# Patient Record
Sex: Female | Born: 1960 | Race: White | Hispanic: No | Marital: Married | Smoking: Never smoker
Health system: Southern US, Community
[De-identification: ages and names within clinical notes are randomized; demographics above are authoritative.]

## PROBLEM LIST (undated history)

## (undated) DIAGNOSIS — Z780 Asymptomatic menopausal state: Secondary | ICD-10-CM

## (undated) DIAGNOSIS — I10 Essential (primary) hypertension: Secondary | ICD-10-CM

---

## 2017-06-20 ENCOUNTER — Emergency Department: Payer: BC Managed Care – PPO

## 2017-06-20 ENCOUNTER — Other Ambulatory Visit: Payer: Self-pay

## 2017-06-20 ENCOUNTER — Encounter: Payer: Self-pay | Admitting: Emergency Medicine

## 2017-06-20 ENCOUNTER — Observation Stay: Payer: BC Managed Care – PPO

## 2017-06-20 ENCOUNTER — Observation Stay
Admission: EM | Admit: 2017-06-20 | Discharge: 2017-06-22 | Disposition: A | Discharge status: Home / Self Care | Payer: BC Managed Care – PPO | Attending: Orthopaedic Surgery | Admitting: Orthopaedic Surgery

## 2017-06-20 DIAGNOSIS — S82851A Displaced trimalleolar fracture of right lower leg, initial encounter for closed fracture: Secondary | ICD-10-CM | POA: Diagnosis not present

## 2017-06-20 DIAGNOSIS — I10 Essential (primary) hypertension: Secondary | ICD-10-CM | POA: Insufficient documentation

## 2017-06-20 DIAGNOSIS — S99911A Unspecified injury of right ankle, initial encounter: Secondary | ICD-10-CM | POA: Diagnosis present

## 2017-06-20 DIAGNOSIS — Z01818 Encounter for other preprocedural examination: Secondary | ICD-10-CM | POA: Diagnosis present

## 2017-06-20 DIAGNOSIS — W109XXA Fall (on) (from) unspecified stairs and steps, initial encounter: Secondary | ICD-10-CM | POA: Insufficient documentation

## 2017-06-20 DIAGNOSIS — S82409A Unspecified fracture of shaft of unspecified fibula, initial encounter for closed fracture: Secondary | ICD-10-CM

## 2017-06-20 DIAGNOSIS — S82891A Other fracture of right lower leg, initial encounter for closed fracture: Secondary | ICD-10-CM

## 2017-06-20 HISTORY — DX: Essential (primary) hypertension: I10

## 2017-06-20 HISTORY — DX: Asymptomatic menopausal state: Z78.0

## 2017-06-20 LAB — BASIC METABOLIC PANEL
ANION GAP: 14 (ref 5–15)
BUN: 20 mg/dL (ref 6–20)
CALCIUM: 9.4 mg/dL (ref 8.9–10.3)
CO2: 21 mmol/L — ABNORMAL LOW (ref 22–32)
Chloride: 100 mmol/L — ABNORMAL LOW (ref 101–111)
Creatinine, Ser: 0.84 mg/dL (ref 0.44–1.00)
GLUCOSE: 110 mg/dL — AB (ref 65–99)
Potassium: 3.2 mmol/L — ABNORMAL LOW (ref 3.5–5.1)
SODIUM: 135 mmol/L (ref 135–145)

## 2017-06-20 LAB — CBC WITH DIFFERENTIAL/PLATELET
BASOS ABS: 0.1 10*3/uL (ref 0–0.1)
BASOS PCT: 2 %
Eosinophils Absolute: 0.1 10*3/uL (ref 0–0.7)
Eosinophils Relative: 1 %
HEMATOCRIT: 35.7 % (ref 35.0–47.0)
Hemoglobin: 11.7 g/dL — ABNORMAL LOW (ref 12.0–16.0)
Lymphocytes Relative: 39 %
Lymphs Abs: 2.5 10*3/uL (ref 1.0–3.6)
MCH: 29.6 pg (ref 26.0–34.0)
MCHC: 32.8 g/dL (ref 32.0–36.0)
MCV: 90.1 fL (ref 80.0–100.0)
MONO ABS: 0.7 10*3/uL (ref 0.2–0.9)
Monocytes Relative: 11 %
NEUTROS ABS: 3 10*3/uL (ref 1.4–6.5)
NEUTROS PCT: 47 %
Platelets: 238 10*3/uL (ref 150–440)
RBC: 3.96 MIL/uL (ref 3.80–5.20)
RDW: 13 % (ref 11.5–14.5)
WBC: 6.4 10*3/uL (ref 3.6–11.0)

## 2017-06-20 LAB — TYPE AND SCREEN
ABO/RH(D): A NEG
Antibody Screen: NEGATIVE

## 2017-06-20 LAB — PROTIME-INR
INR: 1
Prothrombin Time: 13.1 seconds (ref 11.4–15.2)

## 2017-06-20 MED ORDER — PROPOFOL 10 MG/ML IV BOLUS
INTRAVENOUS | Status: AC | PRN
  Administered 2017-06-20: 70 mg via INTRAVENOUS
  Administered 2017-06-20: 35 mg via INTRAVENOUS

## 2017-06-20 MED ORDER — ONDANSETRON HCL 4 MG PO TABS: 4.0000 mg | ORAL_TABLET | Freq: Four times a day (QID) | ORAL | Status: DC | PRN

## 2017-06-20 MED ORDER — ACETAMINOPHEN 325 MG PO TABS: 650.0000 mg | ORAL_TABLET | Freq: Four times a day (QID) | ORAL | Status: DC | PRN

## 2017-06-20 MED ORDER — BISACODYL 5 MG PO TBEC: 5.0000 mg | DELAYED_RELEASE_TABLET | Freq: Every day | ORAL | Status: DC | PRN

## 2017-06-20 MED ORDER — METHOCARBAMOL 1000 MG/10ML IJ SOLN: 500.0000 mg | Freq: Four times a day (QID) | INTRAVENOUS | Status: DC | PRN

## 2017-06-20 MED ORDER — HYDROMORPHONE HCL 1 MG/ML IJ SOLN
INTRAMUSCULAR | Status: AC
  Administered 2017-06-20: 1 mg via INTRAVENOUS
  Filled 2017-06-20: qty 1

## 2017-06-20 MED ORDER — METHOCARBAMOL 500 MG PO TABS
500.0000 mg | ORAL_TABLET | Freq: Four times a day (QID) | ORAL | Status: DC | PRN
  Administered 2017-06-20 – 2017-06-22 (×2): 500 mg via ORAL
  Filled 2017-06-20 (×2): qty 1

## 2017-06-20 MED ORDER — PROPOFOL 10 MG/ML IV BOLUS
INTRAVENOUS | Status: AC
  Filled 2017-06-20: qty 20

## 2017-06-20 MED ORDER — DOCUSATE SODIUM 100 MG PO CAPS
100.0000 mg | ORAL_CAPSULE | Freq: Two times a day (BID) | ORAL | Status: DC
  Administered 2017-06-21: 100 mg via ORAL
  Filled 2017-06-20 (×2): qty 1

## 2017-06-20 MED ORDER — ZOLPIDEM TARTRATE 5 MG PO TABS: 5.0000 mg | ORAL_TABLET | Freq: Every evening | ORAL | Status: DC | PRN

## 2017-06-20 MED ORDER — HYDROMORPHONE HCL 1 MG/ML IJ SOLN
1.0000 mg | Freq: Once | INTRAMUSCULAR | Status: AC
  Administered 2017-06-20: 1 mg via INTRAVENOUS

## 2017-06-20 MED ORDER — ONDANSETRON HCL 4 MG/2ML IJ SOLN
4.0000 mg | Freq: Four times a day (QID) | INTRAMUSCULAR | Status: DC | PRN
Start: 2017-06-20 — End: 2017-06-22
  Administered 2017-06-21: 4 mg via INTRAVENOUS

## 2017-06-20 MED ORDER — MAGNESIUM CITRATE PO SOLN: 1.0000 | Freq: Once | ORAL | Status: DC | PRN

## 2017-06-20 MED ORDER — HYDROMORPHONE HCL 1 MG/ML IJ SOLN
INTRAMUSCULAR | Status: AC | PRN
  Administered 2017-06-20: 1 mg via INTRAVENOUS

## 2017-06-20 MED ORDER — HYDROMORPHONE HCL 1 MG/ML IJ SOLN
INTRAMUSCULAR | Status: AC
  Filled 2017-06-20: qty 1

## 2017-06-20 MED ORDER — ONDANSETRON HCL 4 MG/2ML IJ SOLN
INTRAMUSCULAR | Status: AC
  Administered 2017-06-20: 4 mg via INTRAVENOUS
  Filled 2017-06-20: qty 2

## 2017-06-20 MED ORDER — HYDROMORPHONE HCL 1 MG/ML IJ SOLN
0.5000 mg | INTRAMUSCULAR | Status: DC | PRN
  Administered 2017-06-20 (×2): 0.5 mg via INTRAVENOUS
  Filled 2017-06-20: qty 1

## 2017-06-20 MED ORDER — ONDANSETRON HCL 4 MG/2ML IJ SOLN
4.0000 mg | Freq: Once | INTRAMUSCULAR | Status: AC
  Administered 2017-06-20: 4 mg via INTRAVENOUS

## 2017-06-20 MED ORDER — ACETAMINOPHEN 650 MG RE SUPP: 650.0000 mg | Freq: Four times a day (QID) | RECTAL | Status: DC | PRN

## 2017-06-20 MED ORDER — OXYCODONE HCL 5 MG PO TABS
5.0000 mg | ORAL_TABLET | ORAL | Status: DC | PRN
  Administered 2017-06-20 – 2017-06-21 (×2): 5 mg via ORAL
  Administered 2017-06-21 – 2017-06-22 (×3): 10 mg via ORAL
  Administered 2017-06-22 (×2): 5 mg via ORAL
  Administered 2017-06-22: 10 mg via ORAL
  Filled 2017-06-20: qty 2
  Filled 2017-06-20: qty 1
  Filled 2017-06-20: qty 2
  Filled 2017-06-20 (×2): qty 1
  Filled 2017-06-20: qty 2
  Filled 2017-06-20 (×2): qty 1
  Filled 2017-06-20: qty 2

## 2017-06-20 MED ORDER — KCL IN DEXTROSE-NACL 20-5-0.45 MEQ/L-%-% IV SOLN
INTRAVENOUS | Status: DC
  Administered 2017-06-21: 02:00:00 via INTRAVENOUS
  Filled 2017-06-20 (×2): qty 1000

## 2017-06-20 MED ORDER — DIPHENHYDRAMINE HCL 12.5 MG/5ML PO ELIX: 12.5000 mg | ORAL_SOLUTION | ORAL | Status: DC | PRN

## 2017-06-20 MED ORDER — SODIUM CHLORIDE 0.9 % IV SOLN
INTRAVENOUS | Status: AC | PRN
  Administered 2017-06-20: 1000 mL via INTRAVENOUS

## 2017-06-20 MED ORDER — HYDROMORPHONE HCL 1 MG/ML IJ SOLN: 1.0000 mg | Freq: Once | INTRAMUSCULAR | Status: DC

## 2017-06-20 NOTE — ED Triage Notes (Signed)
Pt tripped - significant swelling and dislocation of rt ankle

## 2017-06-20 NOTE — H&P (Signed)
Subjective: Patient is a 57 y.o. female who presents with history of a fall after twisting her right ankle on a large step. She noted immediate pain and significant deformity. She was unable to bear weight. Pt denies previous history of ankle pain. She denies having any pain elsewhere or other associated injury.   Patient Active Problem List   Diagnosis Date Noted  . Trimalleolar fracture of ankle, closed, right, initial encounter 06/20/2017   Past Medical History:  Diagnosis Date  . Hypertension   . Menopause     History reviewed. No pertinent surgical history.   (Not in a hospital admission) No Known Allergies  Social History   Tobacco Use  . Smoking status: Never Smoker  . Smokeless tobacco: Never Used  Substance Use Topics  . Alcohol use: Yes    Comment: social    History reviewed. No pertinent family history.  Review of Systems A comprehensive review of systems was negative except for: Musculoskeletal: positive for stiff joints and right ankle pain, swelling, deformity  Objective: Vital signs in last 24 hours: Temp:  [97.9 F (36.6 C)] 97.9 F (36.6 C) (03/30 1829) Pulse Rate:  [80-93] 87 (03/30 2100) Resp:  [11-18] 13 (03/30 2100) BP: (122-149)/(82-108) 143/87 (03/30 2100) SpO2:  [94 %-100 %] 95 % (03/30 2100) Weight:  [69.9 kg (154 lb)] 69.9 kg (154 lb) (03/30 1818)  General appearance: alert, cooperative and no distress  HEENT: NCAT Lungs: CTA bilaterally Heart: Regular rate and rhythm.  Abdomen: soft, nontender, nondistended Neuro: Cranial nerves 2-12 grossly intact without deficit Musculoskeletal:  Right ankle with moderate edema. No open wounds. No skin compromise. Foot warm and well perfused with palpable distal pulses.   Imaging Review R ankle: Trimalleolar ankle fracture/dislocation with comminution, persistent joint subluxation/instability  Assessment/Plan: right Trimalleolar ankle fracture/dislocation  Ice/strict elevation in splint CT scan right  ankle NPO On call to OR 3/31 for ORIF, possible ex fix right ankle Risks of surgery to include but not limited to: infection, bleeding, blood clots, malunion, nonunion, wound healing problems, post-traumatic arthritis, chronic pain, any risks of anesthesia, need for further procedures were discussed. She does understand these risks and agrees to proceed.

## 2017-06-20 NOTE — ED Provider Notes (Signed)
[facility] Emergency Department Provider Note ____________________________________________   First MD Initiated Contact with Patient 06/20/17 1843     (approximate)  I have reviewed the triage vital signs and the nursing notes.   HISTORY  Chief Complaint Ankle Injury    HPI Jamie Barnes is a 57 y.o. female with history of hypertension who presents with right ankle injury, acute onset when she fell off a step, associated with deformity, not associate with any other injuries.  Patient has not been able to bear weight on the ankle.  Past Medical History:  Diagnosis Date  . Hypertension   . Menopause     There are no active problems to display for this patient.   History reviewed. No pertinent surgical history.  Prior to Admission medications   Not on File    Allergies Patient has no known allergies.  History reviewed. No pertinent family history.  Social History Social History   Tobacco Use  . Smoking status: Never Smoker  . Smokeless tobacco: Never Used  Substance Use Topics  . Alcohol use: Yes    Comment: social  . Drug use: Not Currently    Review of Systems  Constitutional: No fever. Eyes: No redness. ENT: No neck pain. Cardiovascular: Denies chest pain. Respiratory: Denies shortness of breath. Gastrointestinal: No vomiting.  Genitourinary: Negative for flank pain.  Musculoskeletal: Positive for right ankle injury. Skin: Negative for abrasion or laceration. Neurological: Negative for focal weakness or numbness.   ____________________________________________   PHYSICAL EXAM:  VITAL SIGNS: ED Triage Vitals  Enc Vitals Group     BP 06/20/17 1829 (!) 149/85     Pulse Rate 06/20/17 1829 82     Resp 06/20/17 1829 18     Temp 06/20/17 1829 97.9 F (36.6 C)     Temp src --      SpO2 06/20/17 1829 100 %     Weight 06/20/17 1818 154 lb (69.9 kg)     Height 06/20/17 1818 5\' 9"  (1.753 m)     Head Circumference --    Peak Flow --      Pain Score 06/20/17 1818 10     Pain Loc --      Pain Edu? --      Excl. in GC? --     Constitutional: Alert and oriented. Well appearing and in no acute distress. Eyes: Conjunctivae are normal.  Head: Atraumatic. Nose: No congestion/rhinnorhea. Mouth/Throat: Mucous membranes are moist.   Neck: Normal range of motion.  Cardiovascular: Good peripheral circulation. Respiratory: Normal respiratory effort.  Gastrointestinal:  No distention.  Musculoskeletal: Right ankle deformity externally rotated.  2+ DP pulse. Neurologic:  Normal speech and language. No gross focal neurologic deficits are appreciated.  Skin:  Skin is warm and dry. No rash noted. Psychiatric: Mood and affect are normal. Speech and behavior are normal.  ____________________________________________   LABS (all labs ordered are listed, but only abnormal results are displayed)  Labs Reviewed  BASIC METABOLIC PANEL  CBC WITH DIFFERENTIAL/PLATELET  PROTIME-INR  TYPE AND SCREEN   ____________________________________________  EKG   ____________________________________________  RADIOLOGY  XR: R ankle fracture/dislocation  ____________________________________________   PROCEDURES  Procedure(s) performed: No  .Sedation Date/Time: 06/20/2017 8:18 PM Performed by: Dionne BucySiadecki, Eldwin Volkov, MD Authorized by: Dionne BucySiadecki, Koral Thaden, MD   Consent:    Consent obtained:  Written   Consent given by:  Patient   Risks discussed:  Prolonged sedation necessitating reversal, inadequate sedation, respiratory compromise necessitating ventilatory assistance and intubation and vomiting  Alternatives discussed:  Analgesia without sedation Universal protocol:    Immediately prior to procedure a time out was called: yes   Indications:    Procedure performed:  Fracture reduction   Procedure necessitating sedation performed by:  Physician performing sedation   Intended level of sedation:  Moderate (conscious  sedation) Pre-sedation assessment:    Time since last food or drink:  4 hours   ASA classification: class 1 - normal, healthy patient     Mallampati score:  I - soft palate, uvula, fauces, pillars visible   Pre-sedation assessments completed and reviewed: airway patency, cardiovascular function, pain level and respiratory function   Immediate pre-procedure details:    Verified: bag valve mask available, oxygen available and suction available   Procedure details (see MAR for exact dosages):    Total Provider sedation time (minutes):  6 Post-procedure details:    Recovery: Patient returned to pre-procedure baseline     Post-sedation assessments completed and reviewed: cardiovascular function, mental status, pain level and respiratory function     Patient is stable for discharge or admission: yes     Patient tolerance:  Tolerated well, no immediate complications Reduction of fracture Date/Time: 06/20/2017 8:20 PM Performed by: Dionne Bucy, MD Authorized by: Dionne Bucy, MD  Consent: Written consent obtained. Risks and benefits: risks, benefits and alternatives were discussed Consent given by: patient Patient identity confirmed: verbally with patient and arm band Time out: Immediately prior to procedure a "time out" was called to verify the correct patient, procedure, equipment, support staff and site/side marked as required.  Sedation: Patient sedated: yes Sedatives: propofol  Patient tolerance: Patient tolerated the procedure well with no immediate complications Comments: Right ankle fracture/dislocation reduced via traction.  Posterior and U-splint placed.  Postreduction x-rays obtained.     Critical Care performed: No ____________________________________________   INITIAL IMPRESSION / ASSESSMENT AND PLAN / ED COURSE  Pertinent labs & imaging results that were available during my care of the patient were reviewed by me and considered in my medical decision  making (see chart for details).  57 year old female presents with right ankle injury after she fell off a step with market deformity.  Neuro/vascular intact on exam.  X-ray reveals fracture/dislocation at the distal tibia and fibula.  Plan: Analgesia, Ortho consult, and further management per Ortho recs.    ----------------------------------------- 8:21 PM on 06/20/2017 -----------------------------------------  I consulted Dr. Mardene Speak from orthopedics who advised reduction and then would reassess.  I performed a reduction under propofol sedation.  The alignment laterally was significantly improved although the AP alignment was still suboptimal.  I discussed the case again with Dr. Mardene Speak who reviewed the images and recommended that due to the unstable nature of the fracture, patient would benefit from admission and likely surgery tomorrow.  The patient agrees with this plan.  The ankle is neuro/vascular intact after the procedure, and patient's pain is significantly improved.  She has intact fine touch sensation and motor distally, and 2+ DP pulse.  ____________________________________________   FINAL CLINICAL IMPRESSION(S) / ED DIAGNOSES  Final diagnoses:  Right ankle injury  Fracture closed, fibula, shaft      NEW MEDICATIONS STARTED DURING THIS VISIT:  New Prescriptions   No medications on file     Note:  This document was prepared using Dragon voice recognition software and may include unintentional dictation errors.    Dionne Bucy, MD 06/20/17 2023

## 2017-06-21 ENCOUNTER — Observation Stay: Payer: BC Managed Care – PPO | Admitting: Anesthesiology

## 2017-06-21 ENCOUNTER — Encounter: Payer: Self-pay | Admitting: Orthopaedic Surgery

## 2017-06-21 ENCOUNTER — Encounter
Admission: EM | Disposition: A | Discharge status: Home / Self Care | Payer: Self-pay | Source: Home / Self Care | Attending: Emergency Medicine

## 2017-06-21 ENCOUNTER — Observation Stay: Payer: BC Managed Care – PPO

## 2017-06-21 ENCOUNTER — Other Ambulatory Visit: Payer: Self-pay

## 2017-06-21 HISTORY — PX: ORIF ANKLE FRACTURE: SHX5408

## 2017-06-21 LAB — SURGICAL PCR SCREEN
MRSA, PCR: NEGATIVE
Staphylococcus aureus: NEGATIVE

## 2017-06-21 SURGERY — OPEN REDUCTION INTERNAL FIXATION (ORIF) ANKLE FRACTURE
Anesthesia: Spinal | Laterality: Right

## 2017-06-21 MED ORDER — DOCUSATE SODIUM 100 MG PO CAPS: 100.0000 mg | ORAL_CAPSULE | Freq: Two times a day (BID) | ORAL | Status: DC

## 2017-06-21 MED ORDER — METOCLOPRAMIDE HCL 5 MG/ML IJ SOLN: 5.0000 mg | Freq: Three times a day (TID) | INTRAMUSCULAR | Status: DC | PRN

## 2017-06-21 MED ORDER — MIDAZOLAM HCL 2 MG/2ML IJ SOLN
INTRAMUSCULAR | Status: AC
Start: 2017-06-21 — End: 2017-06-21
  Filled 2017-06-21: qty 2

## 2017-06-21 MED ORDER — PROPOFOL 10 MG/ML IV BOLUS
INTRAVENOUS | Status: AC
  Filled 2017-06-21: qty 20

## 2017-06-21 MED ORDER — MEDROXYPROGESTERONE ACETATE 10 MG PO TABS
10.0000 mg | ORAL_TABLET | Freq: Every day | ORAL | Status: DC
  Filled 2017-06-21: qty 1

## 2017-06-21 MED ORDER — KCL IN DEXTROSE-NACL 20-5-0.45 MEQ/L-%-% IV SOLN
INTRAVENOUS | Status: DC
  Administered 2017-06-21: 14:00:00 via INTRAVENOUS

## 2017-06-21 MED ORDER — NEOMYCIN-POLYMYXIN B GU 40-200000 IR SOLN
Status: AC
  Filled 2017-06-21: qty 2

## 2017-06-21 MED ORDER — ONDANSETRON HCL 4 MG/2ML IJ SOLN
INTRAMUSCULAR | Status: AC
  Filled 2017-06-21: qty 2

## 2017-06-21 MED ORDER — BUPIVACAINE HCL (PF) 0.5 % IJ SOLN
INTRAMUSCULAR | Status: AC
  Filled 2017-06-21: qty 10

## 2017-06-21 MED ORDER — CEFAZOLIN SODIUM-DEXTROSE 1-4 GM/50ML-% IV SOLN
1.0000 g | Freq: Four times a day (QID) | INTRAVENOUS | Status: AC
  Administered 2017-06-21 – 2017-06-22 (×3): 1 g via INTRAVENOUS
  Filled 2017-06-21 (×3): qty 50

## 2017-06-21 MED ORDER — ESTRADIOL 1 MG PO TABS
1.0000 mg | ORAL_TABLET | Freq: Every day | ORAL | Status: DC
  Administered 2017-06-21: 1 mg via ORAL
  Filled 2017-06-21 (×2): qty 1

## 2017-06-21 MED ORDER — PROPOFOL 500 MG/50ML IV EMUL
INTRAVENOUS | Status: DC | PRN
  Administered 2017-06-21: 75 ug/kg/min via INTRAVENOUS

## 2017-06-21 MED ORDER — BUPIVACAINE HCL (PF) 0.5 % IJ SOLN
INTRAMUSCULAR | Status: DC | PRN
  Administered 2017-06-21: 2.6 mL

## 2017-06-21 MED ORDER — SODIUM CHLORIDE 0.9 % IV SOLN
INTRAVENOUS | Status: DC | PRN
  Administered 2017-06-21: 30 ug/min via INTRAVENOUS

## 2017-06-21 MED ORDER — LACTATED RINGERS IV SOLN
INTRAVENOUS | Status: DC | PRN
  Administered 2017-06-21 (×2): via INTRAVENOUS

## 2017-06-21 MED ORDER — KETOROLAC TROMETHAMINE 30 MG/ML IJ SOLN
INTRAMUSCULAR | Status: AC
  Filled 2017-06-21: qty 1

## 2017-06-21 MED ORDER — METOCLOPRAMIDE HCL 10 MG PO TABS: 5.0000 mg | ORAL_TABLET | Freq: Three times a day (TID) | ORAL | Status: DC | PRN

## 2017-06-21 MED ORDER — PANTOPRAZOLE SODIUM 40 MG PO TBEC
40.0000 mg | DELAYED_RELEASE_TABLET | Freq: Every day | ORAL | Status: DC
  Filled 2017-06-21: qty 1

## 2017-06-21 MED ORDER — ACETAMINOPHEN 325 MG PO TABS: 325.0000 mg | ORAL_TABLET | Freq: Four times a day (QID) | ORAL | Status: DC | PRN

## 2017-06-21 MED ORDER — FENTANYL CITRATE (PF) 100 MCG/2ML IJ SOLN
INTRAMUSCULAR | Status: AC
Start: 2017-06-21 — End: 2017-06-21
  Filled 2017-06-21: qty 2

## 2017-06-21 MED ORDER — ONDANSETRON HCL 4 MG/2ML IJ SOLN: 4.0000 mg | Freq: Four times a day (QID) | INTRAMUSCULAR | Status: DC | PRN

## 2017-06-21 MED ORDER — VENLAFAXINE HCL ER 37.5 MG PO CP24
75.0000 mg | ORAL_CAPSULE | Freq: Every day | ORAL | Status: DC
  Administered 2017-06-21: 75 mg via ORAL
  Filled 2017-06-21 (×2): qty 2

## 2017-06-21 MED ORDER — ONDANSETRON HCL 4 MG PO TABS: 4.0000 mg | ORAL_TABLET | Freq: Four times a day (QID) | ORAL | Status: DC | PRN

## 2017-06-21 MED ORDER — CEFAZOLIN SODIUM-DEXTROSE 2-3 GM-%(50ML) IV SOLR
INTRAVENOUS | Status: DC | PRN
  Administered 2017-06-21: 2 g via INTRAVENOUS

## 2017-06-21 MED ORDER — BUPIVACAINE-EPINEPHRINE 0.25% -1:200000 IJ SOLN
INTRAMUSCULAR | Status: DC | PRN
  Administered 2017-06-21: 20 mL

## 2017-06-21 MED ORDER — KETOROLAC TROMETHAMINE 15 MG/ML IJ SOLN
INTRAMUSCULAR | Status: DC | PRN
  Administered 2017-06-21: 30 mg via INTRAVENOUS

## 2017-06-21 MED ORDER — TETRACAINE HCL 1 % IJ SOLN
INTRAMUSCULAR | Status: DC | PRN
  Administered 2017-06-21: 5 mg via INTRASPINAL

## 2017-06-21 MED ORDER — MIDAZOLAM HCL 5 MG/5ML IJ SOLN
INTRAMUSCULAR | Status: DC | PRN
  Administered 2017-06-21: 2 mg via INTRAVENOUS

## 2017-06-21 MED ORDER — FENTANYL CITRATE (PF) 100 MCG/2ML IJ SOLN: 25.0000 ug | INTRAMUSCULAR | Status: DC | PRN

## 2017-06-21 MED ORDER — ACETAMINOPHEN 10 MG/ML IV SOLN
INTRAVENOUS | Status: DC | PRN
  Administered 2017-06-21: 1000 mg via INTRAVENOUS

## 2017-06-21 MED ORDER — BUPIVACAINE-EPINEPHRINE (PF) 0.25% -1:200000 IJ SOLN
INTRAMUSCULAR | Status: AC
  Filled 2017-06-21: qty 30

## 2017-06-21 MED ORDER — PROPOFOL 10 MG/ML IV BOLUS
INTRAVENOUS | Status: DC | PRN
  Administered 2017-06-21: 40 mg via INTRAVENOUS
  Administered 2017-06-21: 50 mg via INTRAVENOUS

## 2017-06-21 MED ORDER — ONDANSETRON HCL 4 MG/2ML IJ SOLN: 4.0000 mg | Freq: Once | INTRAMUSCULAR | Status: DC | PRN

## 2017-06-21 MED ORDER — NEOMYCIN-POLYMYXIN B GU 40-200000 IR SOLN
Status: DC | PRN
  Administered 2017-06-21: 2 mL

## 2017-06-21 MED ORDER — PROPOFOL 500 MG/50ML IV EMUL
INTRAVENOUS | Status: AC
  Filled 2017-06-21: qty 50

## 2017-06-21 MED ORDER — AMLODIPINE BESYLATE 5 MG PO TABS
2.5000 mg | ORAL_TABLET | Freq: Every evening | ORAL | Status: DC
  Administered 2017-06-21: 2.5 mg via ORAL
  Filled 2017-06-21: qty 1

## 2017-06-21 MED ORDER — ACETAMINOPHEN 10 MG/ML IV SOLN
INTRAVENOUS | Status: AC
  Filled 2017-06-21: qty 100

## 2017-06-21 MED ORDER — ENOXAPARIN SODIUM 40 MG/0.4ML ~~LOC~~ SOLN
40.0000 mg | SUBCUTANEOUS | Status: DC
  Administered 2017-06-22: 40 mg via SUBCUTANEOUS
  Filled 2017-06-21: qty 0.4

## 2017-06-21 MED ORDER — FLUTICASONE PROPIONATE 50 MCG/ACT NA SUSP
1.0000 | Freq: Every day | NASAL | Status: DC
  Filled 2017-06-21: qty 16

## 2017-06-21 SURGICAL SUPPLY — 53 items
BIT DRILL 3.5X122MM AO FIT (BIT) ×2 IMPLANT
BIT DRILL CANN 2.7 (BIT) ×1
BIT DRILL SRG 2.7XCANN AO CPLG (BIT) ×1 IMPLANT
BIT DRL SRG 2.7XCANN AO CPLNG (BIT) ×1
BLADE SURG 15 STRL LF DISP TIS (BLADE) ×1 IMPLANT
BLADE SURG 15 STRL SS (BLADE) ×1
BNDG ESMARK 6X12 TAN STRL LF (GAUZE/BANDAGES/DRESSINGS) ×2 IMPLANT
BNDG PLASTER FAST 4X5 WHT LF (CAST SUPPLIES) ×8 IMPLANT
CHLORAPREP W/TINT 26ML (MISCELLANEOUS) ×4 IMPLANT
CUFF TOURN 24 STER (MISCELLANEOUS) ×2 IMPLANT
CUFF TOURN 30 STER DUAL PORT (MISCELLANEOUS) IMPLANT
DRAPE C-ARM XRAY 36X54 (DRAPES) ×2 IMPLANT
DRAPE INCISE IOBAN 66X45 STRL (DRAPES) ×2 IMPLANT
DRAPE U-SHAPE 47X51 STRL (DRAPES) ×2 IMPLANT
DRILL 2.6X122MM WL AO SHAFT (BIT) ×2 IMPLANT
GAUZE PETRO XEROFOAM 1X8 (MISCELLANEOUS) ×2 IMPLANT
GAUZE SPONGE 4X4 12PLY STRL (GAUZE/BANDAGES/DRESSINGS) ×2 IMPLANT
GAUZE XEROFORM 4X4 STRL (GAUZE/BANDAGES/DRESSINGS) ×2 IMPLANT
GLOVE BIOGEL PI IND STRL 8 (GLOVE) ×1 IMPLANT
GLOVE BIOGEL PI INDICATOR 8 (GLOVE) ×1
GLOVE ORTHO TXT STRL SZ7.5 (GLOVE) ×4 IMPLANT
GOWN STRL REUS W/ TWL LRG LVL3 (GOWN DISPOSABLE) ×2 IMPLANT
GOWN STRL REUS W/TWL LRG LVL3 (GOWN DISPOSABLE) ×2
K-WIRE 1.6X150 (WIRE) ×3
K-WIRE FX150X1.6XKRSH (WIRE) ×3
K-WIRE ORTHOPEDIC 1.4X150L (WIRE) ×4
KIT TURNOVER KIT A (KITS) ×2 IMPLANT
KWIRE FX150X1.6XKRSH (WIRE) ×3 IMPLANT
KWIRE ORTHOPEDIC 1.4X150L (WIRE) ×2 IMPLANT
LABEL OR SOLS (LABEL) ×2 IMPLANT
NS IRRIG 500ML POUR BTL (IV SOLUTION) ×2 IMPLANT
PACK EXTREMITY ARMC (MISCELLANEOUS) ×2 IMPLANT
PAD ABD DERMACEA PRESS 5X9 (GAUZE/BANDAGES/DRESSINGS) ×2 IMPLANT
PAD CAST CTTN 4X4 STRL (SOFTGOODS) ×1 IMPLANT
PADDING CAST COTTON 4X4 STRL (SOFTGOODS) ×1
PLATE DISTAL FIBULA 6HOLD ANKL (Plate) ×2 IMPLANT
PUTTY DBX 5CC (Putty) ×2 IMPLANT
SCREW 50X4.0MM (Screw) ×4 IMPLANT
SCREW BONE 14MMX3.5MM (Screw) ×2 IMPLANT
SCREW BONE 3.5X20MM (Screw) ×2 IMPLANT
SCREW BONE NON-LCKING 3.5X12MM (Screw) ×6 IMPLANT
SCREW LOCK 3.5X14 (Screw) ×2 IMPLANT
SCREW LOCKING 3.5X12 (Screw) ×2 IMPLANT
SCREW LOCKING 3.5X16MM (Screw) ×4 IMPLANT
SPLINT CAST 1 STEP 4X30 (MISCELLANEOUS) ×4 IMPLANT
SPONGE LAP 18X18 5 PK (GAUZE/BANDAGES/DRESSINGS) ×2 IMPLANT
STAPLER SKIN PROX 35W (STAPLE) ×2 IMPLANT
STOCKINETTE STRL 6IN 960660 (GAUZE/BANDAGES/DRESSINGS) ×2 IMPLANT
SUT ETHILON 3-0 (SUTURE) ×2 IMPLANT
SUT VIC AB 0 SH 27 (SUTURE) ×2 IMPLANT
SUT VIC AB 2-0 SH 27 (SUTURE) ×1
SUT VIC AB 2-0 SH 27XBRD (SUTURE) ×1 IMPLANT
WASHER V FOOT (Washer) ×2 IMPLANT

## 2017-06-21 NOTE — Anesthesia Procedure Notes (Signed)
Date/Time: 06/21/2017 9:50 AM Performed by: Junious SilkNoles, Adileny Delon, CRNA Pre-anesthesia Checklist: Patient identified, Emergency Drugs available, Suction available, Patient being monitored and Timeout performed Oxygen Delivery Method: Simple face mask

## 2017-06-21 NOTE — Anesthesia Procedure Notes (Signed)
Spinal  Patient location during procedure: OR Start time: 06/21/2017 9:20 AM End time: 06/21/2017 9:30 AM Staffing Resident/CRNA: Nelda Marseille, CRNA Performed: resident/CRNA  Preanesthetic Checklist Completed: patient identified, site marked, surgical consent, pre-op evaluation, timeout performed, IV checked, risks and benefits discussed and monitors and equipment checked Spinal Block Patient position: sitting Prep: Betadine Patient monitoring: heart rate, continuous pulse ox, blood pressure and cardiac monitor Approach: midline Location: L3-4 Injection technique: single-shot Needle Needle type: Whitacre and Introducer  Needle gauge: 25 G Needle length: 9 cm Assessment Sensory level: T10 Additional Notes Negative paresthesia. Negative blood return. Positive free-flowing CSF. Expiration date of kit checked and confirmed. Patient tolerated procedure well, without complications.

## 2017-06-21 NOTE — Transfer of Care (Signed)
Immediate Anesthesia Transfer of Care Note  Patient: Jamie Barnes  Procedure(s) Performed: OPEN REDUCTION INTERNAL FIXATION (ORIF) ANKLE FRACTURE Possible External Fixator (Right )  Patient Location: PACU  Anesthesia Type:Spinal  Level of Consciousness: awake, alert  and oriented  Airway & Oxygen Therapy: Patient Spontanous Breathing and Patient connected to face mask oxygen  Post-op Assessment: Report given to RN and Post -op Vital signs reviewed and stable  Post vital signs: Reviewed and stable  Last Vitals:  Vitals Value Taken Time  BP    Temp    Pulse    Resp    SpO2      Last Pain:  Vitals:   06/21/17 0801  TempSrc: Oral  PainSc:          Complications: No apparent anesthesia complications

## 2017-06-21 NOTE — Progress Notes (Signed)
Pt off unit to OR

## 2017-06-21 NOTE — Progress Notes (Signed)
Subjective: Pt c/o right ankle pain. Meds controlling   Objective: Vital signs in last 24 hours: Temp:  [97.8 F (36.6 C)-98.1 F (36.7 C)] 97.8 F (36.6 C) (03/31 0801) Pulse Rate:  [80-93] 81 (03/31 0801) Resp:  [11-20] 20 (03/31 0801) BP: (122-149)/(82-108) 129/86 (03/31 0801) SpO2:  [94 %-100 %] 98 % (03/31 0801) Weight:  [69.9 kg (154 lb)] 69.9 kg (154 lb) (03/30 1818)  Intake/Output from previous day: 03/30 0701 - 03/31 0700 In: 161.3 [I.V.:161.3] Out: -  Intake/Output this shift: No intake/output data recorded.  Recent Labs    06/20/17 1827  HGB 11.7*   Recent Labs    06/20/17 1827  WBC 6.4  RBC 3.96  HCT 35.7  PLT 238   Recent Labs    06/20/17 1827  NA 135  K 3.2*  CL 100*  CO2 21*  BUN 20  CREATININE 0.84  GLUCOSE 110*  CALCIUM 9.4   Recent Labs    06/20/17 1827  INR 1.00    Neurologically intact. Splint in place RLE. Moves all toes. RLE distally NVI. No calf TTP    Assessment/Plan: R Trimalleolar ankle fracture/dislocation -on call to OR today for ORIF -NPO -CT reviewed   Carnella GuadalajaraKyle Bain Whichard 06/21/2017, 8:41 AM

## 2017-06-21 NOTE — Anesthesia Postprocedure Evaluation (Signed)
Anesthesia Post Note  Patient: Jamie Barnes  Procedure(s) Performed: OPEN REDUCTION INTERNAL FIXATION (ORIF) ANKLE FRACTURE Possible External Fixator (Right )  Patient location during evaluation: PACU Anesthesia Type: Spinal Level of consciousness: oriented and awake and alert Pain management: pain level controlled Vital Signs Assessment: post-procedure vital signs reviewed and stable Respiratory status: spontaneous breathing, respiratory function stable and patient connected to nasal cannula oxygen Cardiovascular status: blood pressure returned to baseline and stable Postop Assessment: no headache, no backache and no apparent nausea or vomiting Anesthetic complications: no     Last Vitals:  Vitals:   06/21/17 1324 06/21/17 1600  BP: 116/71 131/65  Pulse: 74 82  Resp: 13 18  Temp:  36.7 C  SpO2: 100% 100%    Last Pain:  Vitals:   06/21/17 1600  TempSrc: Oral  PainSc:                  Torez Beauregard S

## 2017-06-21 NOTE — Anesthesia Post-op Follow-up Note (Signed)
Anesthesia QCDR form completed.        

## 2017-06-21 NOTE — Progress Notes (Signed)
Pt arrived back to room 145 from PACU. Skin assessment completed with Marchelle FolksAmanda, RN. Left leg elevated on pillows and ice pack applied. Pt on room air. IV infusing. Pt denies pain at this time. Husband at bedside.

## 2017-06-21 NOTE — Anesthesia Preprocedure Evaluation (Signed)
Anesthesia Evaluation  Patient identified by MRN, date of birth, ID band Patient awake    Reviewed: Allergy & Precautions, NPO status , Patient's Chart, lab work & pertinent test results, reviewed documented beta blocker date and time   Airway Mallampati: II  TM Distance: >3 FB     Dental  (+) Chipped   Pulmonary           Cardiovascular hypertension, Pt. on medications      Neuro/Psych    GI/Hepatic   Endo/Other    Renal/GU      Musculoskeletal   Abdominal   Peds  Hematology   Anesthesia Other Findings   Reproductive/Obstetrics                             Anesthesia Physical Anesthesia Plan  ASA: II  Anesthesia Plan: Spinal   Post-op Pain Management:    Induction:   PONV Risk Score and Plan:   Airway Management Planned:   Additional Equipment:   Intra-op Plan:   Post-operative Plan:   Informed Consent: I have reviewed the patients History and Physical, chart, labs and discussed the procedure including the risks, benefits and alternatives for the proposed anesthesia with the patient or authorized representative who has indicated his/her understanding and acceptance.     Plan Discussed with: CRNA  Anesthesia Plan Comments:         Anesthesia Quick Evaluation

## 2017-06-21 NOTE — Progress Notes (Signed)
Pt up to BSC with one assist 

## 2017-06-21 NOTE — Op Note (Signed)
Procedure(s): OPEN REDUCTION INTERNAL FIXATION (ORIF) ANKLE FRACTURE , right  Jamie Barnes female 57 y.o. 06/21/2017  Procedure(s) and Anesthesia Type:    * OPEN REDUCTION INTERNAL FIXATION (ORIF) ANKLE FRACTURE Possible External Fixator - Choice  Surgeon(s) and Role:    Carnella Guadalajara* Ethleen Lormand, DO - Primary   Indications: Right ankle pain, dislocation     Surgeon: Carnella GuadalajaraKyle Kabria Hetzer D.O.  Assistants: none  Anesthesia: Spinal anesthesia     Procedure Detail  OPEN REDUCTION INTERNAL FIXATION (ORIF) ANKLE FRACTURE , rightFindings: [Jamie Barnes was seen in the preop holding area and the right lower extremity was marked as the correct operative side. She was taken to the OR and positioned supine with a bump under the right hip and all bony prominences well padded. She was given Ancef IV prior to incision. A time out was performed, again identifying the correct patient and right side as the correct operative side.  The right leg was exanguinated and the tourniquet was inflated. A 10cm incision was made laterally centered over the fracture site. The fracture side was irrigated and periosteum was reflected to expose the fracture. There was severe comminution and shortening of the fibula. Reduction clamps were placed. One lag screw was placed across the 2 main fragments.  A bridging plate was chosen and was placed with cortical screws proximal and distal to the fracture site.    Locking screws were placed distally. An anatomic reduction was achieved.  5cc of Vitoss bone graft was placed at the comminuted fracture site. Medially, a 3cm incision was made and the fracture was reduced with a pointed reduction clamp.  2 4.830mm cancellous screws were placed and a good reduction was achieved.  Posterior malleolar fragment was small < 20% of articular surface and reduced with fibular fixation.  I stressed the ankle at this point and there was no instability noted at the syndesmosis and no mortise widening.  Incisions were  irrigated and closed in layered fashion. 0.25% marcaine was used.  A sterile dressing, posterior and U splint with abundant padding were applied at the end of the case.   Estimated Blood Loss:  less than 50 mL         Drains: none         Total IV Fluids: 1100 ml  Blood Given: none          Specimens: none         Implants: 6-hole distal fibula locking plate.        Complications:  * No complications entered in OR log *         Disposition: PACU - hemodynamically stable.         Condition: stable

## 2017-06-21 NOTE — Brief Op Note (Signed)
06/21/2017  12:55 PM  PATIENT:  Jamie Barnes  57 y.o. female  PRE-OPERATIVE DIAGNOSIS:  Right Trimalleolar Ankle Fracture  POST-OPERATIVE DIAGNOSIS:  same  PROCEDURE:  Procedure(s): OPEN REDUCTION INTERNAL FIXATION (ORIF) ANKLE FRACTURE Possible External Fixator (Right)  SURGEON:  Surgeon(s) and Role:    * Vaniah Chambers, DO - Primary  PHYSICIAN ASSISTANT:   ASSISTANTS: none   ANESTHESIA:   spinal  EBL:  25 mL   BLOOD ADMINISTERED:none  DRAINS: none   LOCAL MEDICATIONS USED:  BUPIVICAINE   SPECIMEN:  No Specimen  DISPOSITION OF SPECIMEN:  N/A  COUNTS:  YES  TOURNIQUET:   Total Tourniquet Time Documented: Thigh (Right) - 120 minutes Total: Thigh (Right) - 120 minutes   DICTATION: .Note written in EPIC  PLAN OF CARE: Admit for overnight observation  PATIENT DISPOSITION:  PACU - hemodynamically stable.   Delay start of Pharmacological VTE agent (>24hrs) due to surgical blood loss or risk of bleeding: yes

## 2017-06-22 LAB — COMPREHENSIVE METABOLIC PANEL
ALBUMIN: 3.4 g/dL — AB (ref 3.5–5.0)
ALT: 14 U/L (ref 14–54)
AST: 28 U/L (ref 15–41)
Alkaline Phosphatase: 54 U/L (ref 38–126)
Anion gap: 7 (ref 5–15)
BILIRUBIN TOTAL: 0.6 mg/dL (ref 0.3–1.2)
BUN: 7 mg/dL (ref 6–20)
CHLORIDE: 103 mmol/L (ref 101–111)
CO2: 27 mmol/L (ref 22–32)
CREATININE: 0.59 mg/dL (ref 0.44–1.00)
Calcium: 8.4 mg/dL — ABNORMAL LOW (ref 8.9–10.3)
GFR calc Af Amer: >60 mL/min (ref >60)
GLUCOSE: 121 mg/dL — AB (ref 65–99)
POTASSIUM: 3.6 mmol/L (ref 3.5–5.1)
Sodium: 137 mmol/L (ref 135–145)
TOTAL PROTEIN: 6.5 g/dL (ref 6.5–8.1)

## 2017-06-22 MED ORDER — OXYCODONE HCL 5 MG PO TABS: 5.0000 mg | ORAL_TABLET | ORAL | 0 refills | Status: AC | PRN

## 2017-06-22 MED ORDER — ENOXAPARIN SODIUM 40 MG/0.4ML ~~LOC~~ SOLN: 40.0000 mg | SUBCUTANEOUS | 0 refills | Status: AC

## 2017-06-22 NOTE — Care Management Note (Addendum)
Case Management Note  Patient Details  Name: Jamie Barnes MRN: [id number] Date of Birth: 10/25/1960  Subjective/Objective:  POD # 1 ORIF right ankle fracture. Spoke with patient. [place] PT pending. Patient discharging today. will need follow up appointment with orthopedist in 1 week. Scheduled  With [facility] [place] [phone]) April 8 at 8:25 am with Dr. Lambert KetoErdin, Patient updated and agrees with POC. Placed on follow up appointments/AVS. Called Lovenox 40 mg # 7 per Lia HoppingJon Wolf, PA to [facility] [phone], Cost is 24.56. Patient updated. Patient states she has a walker. She wants crutches. [facility]dered from [facility]  Waiting on PT recommendations. Have paged Lia HoppingJon Wolf multiple times with no response. He is in surgery. To call back later.Have ask charge nurse to inquire about physical therapy at home or have PA call RNCM                  Action/Plan: Spoke with Lia HoppingJon Wolf, he does not want patient to have any PT after discharge. Patient updated  Expected Discharge Date:  06/22/17               Expected Discharge Plan:     In-House Referral:     Discharge planning Services  CM Consult  Post Acute Care Choice:  Durable Medical Equipment Choice offered to:     DME Arranged:    DME Agency:     HH Arranged:    HH Agency:     Status of Service:  In process, will continue to follow  If discussed at Long Length of Stay Meetings, dates discussed:    Additional Comments:  Marily MemosLisa M Shakeem Stern, RN 06/22/2017, 10:00 AM

## 2017-06-22 NOTE — Evaluation (Signed)
Physical Therapy Evaluation Patient Details Name: Jamie Barnes MRN: [id number] DOB: 11/13/1960 Today's Date: 06/22/2017   History of Present Illness  presented to ER after fall with R ankle pain, injury; admitted with R ankle trimalleolar fracture s/p ORIF (3/31), NWB R LE  Clinical Impression  Upon evaluation, patient alert and oriented; follows all commands and demonstrates good insight/safety awareness.  Bilat UE/LE strength and ROM grossly symmetrical and WFL, except R ankle immobilized in posterior splint/ace wrap (good toe movement, capillary refill noted).  Able to complete all functional mobility with no greater than cga/min assist from therapist; prefers (and does best) with bilat axillary crutches vs RW with mobility efforts.  Do recommend use of bilat axillary crutches (adjusted to patient's height during session) with all mobility at discharge.  Briefly discussed use/need of manual WC for longer, community distances; patient declines at this times.  Reviewed technique for car transfer, curb management and tub transfer bench.  Patient/husband voiced understanding of all information provided.  No additional questions/concerns at this time. Would benefit from skilled PT to address above deficits and promote optimal return to PLOF; Recommend transition to HHPT upon discharge from acute hospitalization,    Follow Up Recommendations Home health PT    Equipment Recommendations  Crutches    Recommendations for Other Services       Precautions / Restrictions Precautions Precautions: Fall Restrictions Weight Bearing Restrictions: Yes RLE Weight Bearing: Non weight bearing      Mobility  Bed Mobility Overal bed mobility: Modified Independent                Transfers Overall transfer level: Needs assistance Equipment used: Rolling walker (2 wheeled) Transfers: Sit to/from Stand Sit to Stand: Min guard         General transfer comment: cuing for hand  placement  Ambulation/Gait Ambulation/Gait assistance: Min guard Ambulation Distance (Feet): 40 Feet Assistive device: Rolling walker (2 wheeled)       General Gait Details: inconsistent step/hop height/length, generally uncomfortable and "unsteady feeling"  Stairs            Wheelchair Mobility    Modified Rankin (Stroke Patients Only)       Balance Overall balance assessment: Needs assistance Sitting-balance support: No upper extremity supported;Feet supported Sitting balance-Leahy Scale: Good     Standing balance support: Bilateral upper extremity supported Standing balance-Leahy Scale: Fair                               Pertinent Vitals/Pain Pain Assessment: Faces Faces Pain Scale: No hurt    Home Living Family/patient expects to be discharged to:: Private residence Living Arrangements: Spouse/significant other Available Help at Discharge: Family Type of Home: Apartment Home Access: Level entry(1 curb to/from parking lot)     Home Layout: One level Home Equipment: None      Prior Function Level of Independence: Independent         Comments: Indep with ADLs, household and community mobilization; working full-time.     Hand Dominance        Extremity/Trunk Assessment   Upper Extremity Assessment Upper Extremity Assessment: Overall WFL for tasks assessed    Lower Extremity Assessment Lower Extremity Assessment: (R ankle immoblized in posterior splint/ace wrap, able to wiggle toes with good capillary refill; otherwise, bilat LEs grossly WFL)       Communication   Communication: No difficulties  Cognition Arousal/Alertness: Awake/alert Behavior During Therapy: WFL for tasks  assessed/performed Overall Cognitive Status: Within Functional Limits for tasks assessed                                        General Comments      Exercises Other Exercises Other Exercises: 57-50' x1 with bilat axillary crutches,  cga/min assist-cuing for placement, technique with sit/stand; encouraged for smaller, more controlled hopping pattern.  Reports feeling more comfortable/confident with bilat axillary crutches, and prefers crutches at discharge; therapist in agreeement Other Exercises: Reviewed positioning and UE protection with crutches; patient voiced understanding. Other Exercises: Verbally reviewed technique for car transfer, curb negotiation and use of tub transfer bench as needed; patient/husband voiced understanding. Other Exercises: Patient with questions about use of knee scooter for mobility; encouraged discussion with orthopedic surgeon prior to initiating use to ensure healing and stability of surgical site.   Assessment/Plan    PT Assessment Patient needs continued PT services  PT Problem List Decreased range of motion;Decreased activity tolerance;Decreased balance;Decreased mobility;Decreased safety awareness;Decreased knowledge of precautions;Decreased knowledge of use of DME       PT Treatment Interventions DME instruction;Gait training;Stair training;Functional mobility training;Therapeutic activities;Therapeutic exercise;Balance training;Patient/family education    PT Goals (Current goals can be found in the Care Plan section)  Acute Rehab PT Goals Patient Stated Goal: to return home PT Goal Formulation: With patient/family Time For Goal Achievement: 07/06/17 Potential to Achieve Goals: Good    Frequency 7X/week   Barriers to discharge        Co-evaluation               AM-PAC PT "6 Clicks" Daily Activity  Outcome Measure Difficulty turning over in bed (including adjusting bedclothes, sheets and blankets)?: None Difficulty moving from lying on back to sitting on the side of the bed? : None Difficulty sitting down on and standing up from a chair with arms (e.g., wheelchair, bedside commode, etc,.)?: None Help needed moving to and from a bed to chair (including a wheelchair)?: A  Little Help needed walking in hospital room?: A Little Help needed climbing 3-5 steps with a railing? : A Little 6 Click Score: 21    End of Session Equipment Utilized During Treatment: Gait belt Activity Tolerance: Patient tolerated treatment well Patient left: in bed;with call bell/phone within reach;with family/visitor present Nurse Communication: Mobility status PT Visit Diagnosis: Difficulty in walking, not elsewhere classified (R26.2);Pain Pain - part of body: Ankle and joints of foot    Time: 1110-1140 PT Time Calculation (min) (ACUTE ONLY): 30 min   Charges:   PT Evaluation $PT Eval Low Complexity: 1 Low PT Treatments $Gait Training: 8-22 mins $Therapeutic Activity: 8-22 mins   PT G Codes:        Loreen Bankson H. Manson Passey, PT, DPT, NCS 06/22/17, 1:41 PM [phone]

## 2017-06-22 NOTE — Discharge Summary (Signed)
Physician Discharge Summary  Patient ID: Jamie Barnes MRN: [id number] DOB/AGE: 56/03/1960 56 y.o.  Admit date: 06/20/2017 Discharge date: 06/22/2017  Admission Diagnoses:  Preop examination [Z01.818] Fracture closed, fibula, shaft [S82.409A] Right ankle injury [S99.911A] Closed fracture of right ankle, initial encounter [S82.891A]   Discharge Diagnoses: Patient Active Problem List   Diagnosis Date Noted  . Trimalleolar fracture of ankle, closed, right, initial encounter 06/20/2017    Past Medical History:  Diagnosis Date  . Hypertension   . Menopause      Transfusion: No transfusions during this admission   Consultants (if any): Treatment Team:  Donato HeinzHooten, James P, MD  Discharged Condition: Improved  Hospital Course: Jamie Echevariann Bey is an 57 y.o. female who was admitted 06/20/2017 with a diagnosis of trimalleolar right ankle fracture and went to the operating room on 06/21/2017 and underwent the above named procedures.    Surgeries:Procedure(s): OPEN REDUCTION INTERNAL FIXATION (ORIF) ANKLE FRACTURE Possible External Fixator on 06/21/2017   Patient tolerated the surgery well. No complications .Patient was taken to PACU where she was stabilized and then transferred to the orthopedic floor.  Patient started on Lovenox 40 mg q 24 hrs.  Heels elevated off bed with rolled towels. No evidence of DVT. Calves non tender. Negative Homan. Physical therapy started on day #1 for gait training and transfer with OT starting on  day #1 for ADL and assisted devices. Patient has done well with therapy.  Patient was able to ambulate with the assistance of rolling walker upon being discharged.  No complications occurred during this admission.  Patient has done very well.   She was given perioperative antibiotics:  Anti-infectives (From admission, onward)   Start     Dose/Rate Route Frequency Ordered Stop   06/21/17 1800  ceFAZolin (ANCEF) IVPB 1 g/50 mL premix     1 g 100 mL/hr over 30 Minutes  Intravenous Every 6 hours 06/21/17 1334 06/22/17 0658    .  She was fitted with AV 1 compression foot pump devices, instructed on heel pumps, early ambulation, and fitted with TED stockings bilaterally for DVT prophylaxis.  She benefited maximally from the hospital stay and there were no complications.    Recent vital signs:  Vitals:   06/22/17 0006 06/22/17 0340  BP: 125/73 125/78  Pulse: 83 83  Resp: 18 18  Temp: 98.3 F (36.8 C) 98.3 F (36.8 C)  SpO2: 99% 98%    Recent laboratory studies:  Lab Results  Component Value Date   HGB 11.7 (L) 06/20/2017   Lab Results  Component Value Date   WBC 6.4 06/20/2017   PLT 238 06/20/2017   Lab Results  Component Value Date   INR 1.00 06/20/2017   Lab Results  Component Value Date   NA 137 06/22/2017   K 3.6 06/22/2017   CL 103 06/22/2017   CO2 27 06/22/2017   BUN 7 06/22/2017   CREATININE 0.59 06/22/2017   GLUCOSE 121 (H) 06/22/2017    Discharge Medications:   Allergies as of 06/22/2017   No Known Allergies     Medication List    TAKE these medications   amLODipine 2.5 MG tablet Commonly known as:  NORVASC Take 2.5 mg by mouth every evening.   cyanocobalamin 1000 MCG/ML injection Commonly known as:  (VITAMIN B-12) Inject Monthly   enoxaparin 40 MG/0.4ML injection Commonly known as:  LOVENOX Inject 0.4 mLs (40 mg total) into the skin daily for 7 days. Start taking on:  06/23/2017   estradiol 1 MG  tablet Commonly known as:  ESTRACE Take 1 mg by mouth daily.   fluticasone 50 MCG/ACT nasal spray Commonly known as:  FLONASE Place 1-2 sprays into both nostrils daily.   medroxyPROGESTERone 10 MG tablet Commonly known as:  PROVERA TAKE 1 TABLET EVERY DAY ON DAYS 1 THRU 12 of the Month   oxyCODONE 5 MG immediate release tablet Commonly known as:  Oxy IR/ROXICODONE Take 1-2 tablets (5-10 mg total) by mouth every 3 (three) hours as needed for breakthrough pain.   venlafaxine XR 75 MG 24 hr capsule Commonly  known as:  EFFEXOR-XR Take 75 mg by mouth daily.            Durable Medical Equipment  (From admission, onward)        Start     Ordered   06/21/17 1335  DME Walker rolling  Once    Question:  Patient needs a walker to treat with the following condition  Answer:  Trimalleolar fracture of ankle, closed, right, initial encounter   06/21/17 1334   06/21/17 1335  DME Bedside commode  Once    Question:  Patient needs a bedside commode to treat with the following condition  Answer:  Trimalleolar fracture of ankle, closed, right, initial encounter   06/21/17 1334      Diagnostic Studies: Dg Ankle 2 Views Right  Result Date: 06/21/2017 CLINICAL DATA:  Right ankle ORIF EXAM: RIGHT ANKLE - 2 VIEW; DG C-ARM 61-120 MIN COMPARISON:  None FLUOROSCOPY TIME:  1 minutes 6 seconds FINDINGS: Medial malleolar fracture transfixed with 2 cannulated screws. Distal fibular fracture transfixed with a metallic side plate and multiple interlocking screws. Ankle mortise is intact. Alignment is near anatomic. IMPRESSION: Interval bimalleolar ORIF. Electronically Signed   By: Elige Ko   On: 06/21/2017 13:50   Dg Ankle Complete Right  Result Date: 06/20/2017 CLINICAL DATA:  Post reduction. EXAM: RIGHT ANKLE - COMPLETE 3+ VIEW COMPARISON:  06/20/2017 FINDINGS: Post reduction and splinting of trimalleolar right ankle fracture demonstrates improved alignment of the fibula and tibia. There is residual angulation at the fibular fracture. There is severe disruption of the ankle mortise. IMPRESSION: Improvement in the alignment of the tibia and fibula, post reduction of severely comminuted trimalleolar fracture of the right ankle, with severe disruption of the ankle mortise. Electronically Signed   By: Ted Mcalpine M.D.   On: 06/20/2017 20:24   Dg Ankle Complete Right  Result Date: 06/20/2017 CLINICAL DATA:  Right ankle injury EXAM: RIGHT ANKLE - COMPLETE 3+ VIEW COMPARISON:  None. FINDINGS: Comminuted distal  right fibula fracture with apex medial angulation and 13 mm lateral displacement of the dominant distal fracture fragment. Comminuted medial malleolus fracture with approximately 2 cm lateral displacement of the dominant distal fracture fragment. Posterior malleolus fracture with approximately 17 mm lateral displacement of the posterior malleolar fracture fragment at the posterior distal right tibia. Posterolateral dislocation of the right talus at right ankle joint. No suspicious focal osseous lesions. Diffuse right ankle soft tissue swelling. No radiopaque foreign body. IMPRESSION: Trimalleolar right ankle fracture-dislocation as detailed. Electronically Signed   By: Delbert Phenix M.D.   On: 06/20/2017 19:36   Ct Ankle Right Wo Contrast  Result Date: 06/20/2017 CLINICAL DATA:  57 year old female with trimalleolar fracture of the right ankle status post reduction. EXAM: CT OF THE RIGHT ANKLE WITHOUT CONTRAST TECHNIQUE: Multidetector CT imaging of the right ankle was performed according to the standard protocol. Multiplanar CT image reconstructions were also generated. COMPARISON:  Radiograph dated  06/20/2017 FINDINGS: Bones/Joint/Cartilage There is a comminuted and displaced fracture of the medial malleolus. There is extension of the fracture into the articular surface of the tibial plafond. There is a comminuted and displaced fracture of the posterior malleolus. Comminuted fracture of the distal fibula with posterior and lateral displacement of the distal fracture fragment. There is disruption of ankle mortise with posteriorly dislocated ankle. The calcaneus and talus and visualized tarsal bones appear intact. Ligaments Suboptimally assessed by CT. Muscles and Tendons There is abutment and apparent entrapment of the tibialis posterior tendon to the fracture of the medial malleolus (series 5, image 111). Clinical correlation is recommended. Soft tissues There is diffuse soft tissue edema and hematoma of the  ankle. Multiple small foci of air noted along the medial aspect of the distal tibia. IMPRESSION: 1. Comminuted and displaced trimalleolar fracture with posteriorly dislocated ankle. 2. Abutment of the posterior tibialis tendon to the fracture of the medial malleolus with possible entrapment. Electronically Signed   By: Elgie Collard M.D.   On: 06/20/2017 22:15   Dg Chest Port 1 View  Result Date: 06/20/2017 CLINICAL DATA:  Preop. EXAM: PORTABLE CHEST 1 VIEW COMPARISON:  None. FINDINGS: The heart size and mediastinal contours are within normal limits. Both lungs are clear. The visualized skeletal structures are unremarkable. IMPRESSION: No acute cardiopulmonary abnormality seen. Electronically Signed   By: Lupita Raider, M.D.   On: 06/20/2017 21:51   Dg Ankle Right Port  Result Date: 06/21/2017 CLINICAL DATA:  Status post right ankle ORIF. EXAM: PORTABLE RIGHT ANKLE - 2 VIEW COMPARISON:  Intraoperative fluoroscopic images earlier today. Ankle radiographs 06/20/2017. FINDINGS: Two cannulated screws have been placed across the medial malleolus fracture. The distal fibula fracture has been transfixed with a sideplate and screws. Alignment is near anatomic. Tibiotalar dislocation on the prior radiographs has been reduced. IMPRESSION: ORIF of distal tibia and fibula fractures as above. Electronically Signed   By: Sebastian Ache M.D.   On: 06/21/2017 15:00   Dg C-arm 1-60 Min  Result Date: 06/21/2017 CLINICAL DATA:  Right ankle ORIF EXAM: RIGHT ANKLE - 2 VIEW; DG C-ARM 61-120 MIN COMPARISON:  None FLUOROSCOPY TIME:  1 minutes 6 seconds FINDINGS: Medial malleolar fracture transfixed with 2 cannulated screws. Distal fibular fracture transfixed with a metallic side plate and multiple interlocking screws. Ankle mortise is intact. Alignment is near anatomic. IMPRESSION: Interval bimalleolar ORIF. Electronically Signed   By: Elige Ko   On: 06/21/2017 13:50   Dg C-arm 1-60 Min  Result Date:  06/21/2017 CLINICAL DATA:  Right ankle ORIF EXAM: RIGHT ANKLE - 2 VIEW; DG C-ARM 61-120 MIN COMPARISON:  None FLUOROSCOPY TIME:  1 minutes 6 seconds FINDINGS: Medial malleolar fracture transfixed with 2 cannulated screws. Distal fibular fracture transfixed with a metallic side plate and multiple interlocking screws. Ankle mortise is intact. Alignment is near anatomic. IMPRESSION: Interval bimalleolar ORIF. Electronically Signed   By: Elige Ko   On: 06/21/2017 13:50    Disposition: Discharge disposition: 01-Home or Self Care       Discharge Instructions    Diet - low sodium heart healthy   Complete by:  As directed    Increase activity slowly   Complete by:  As directed          Signed: WOLFE,JON R. 06/22/2017, 7:21 AM

## 2017-06-22 NOTE — Progress Notes (Signed)
Reviewed discharge instructions and prescriptions with pt and husband, all questions answered. PIV removed, VSS. Crutches at bedside. Pt will be assisted to car via NT.   [place], Latricia HeftKorie G

## 2017-06-22 NOTE — Progress Notes (Signed)
   Subjective: 1 Day Post-Op Procedure(s) (LRB): OPEN REDUCTION INTERNAL FIXATION (ORIF) ANKLE FRACTURE Possible External Fixator (Right) Patient reports pain as moderate.   Patient is well, and has had no acute complaints or problems We will start therapy today.  Plan is to go Home after hospital stay. no nausea and no vomiting Patient denies any chest pains or shortness of breath. Objective: Vital signs in last 24 hours: Temp:  [97 F (36.1 C)-98.3 F (36.8 C)] 98.3 F (36.8 C) (04/01 0340) Pulse Rate:  [69-90] 83 (04/01 0340) Resp:  [7-20] 18 (04/01 0340) BP: (112-133)/(65-86) 125/78 (04/01 0340) SpO2:  [98 %-100 %] 98 % (04/01 0340) Heels are non tender and elevated off the bed using rolled towels with the operative leg being elevated on 2 pillows along with ice pack  Intake/Output from previous day: 03/31 0701 - 04/01 0700 In: 1628.3 [I.V.:1528.3; IV Piggyback:100] Out: 25 [Blood:25] Intake/Output this shift: No intake/output data recorded.  Recent Labs    06/20/17 1827  HGB 11.7*   Recent Labs    06/20/17 1827  WBC 6.4  RBC 3.96  HCT 35.7  PLT 238   Recent Labs    06/20/17 1827 06/22/17 0349  NA 135 137  K 3.2* 3.6  CL 100* 103  CO2 21* 27  BUN 20 7  CREATININE 0.84 0.59  GLUCOSE 110* 121*  CALCIUM 9.4 8.4*   Recent Labs    06/20/17 1827  INR 1.00    EXAM General - Patient is Alert, Appropriate and Oriented Extremity - Neurologically intact Neurovascular intact Sensation intact distally Intact pulses distally Compartment soft Dressing - dressing C/D/I Motor Function - intact, moving foot and toes well on exam.    Past Medical History:  Diagnosis Date  . Hypertension   . Menopause     Assessment/Plan: 1 Day Post-Op Procedure(s) (LRB): OPEN REDUCTION INTERNAL FIXATION (ORIF) ANKLE FRACTURE Possible External Fixator (Right) Active Problems:   Trimalleolar fracture of ankle, closed, right, initial encounter  Estimated body mass  index is 22.74 kg/m as calculated from the following:   Height as of this encounter: 5\' 9"  (1.753 m).   Weight as of this encounter: 69.9 kg (154 lb). Up with therapy  Labs: None DVT Prophylaxis - Lovenox Nonweightbearing right lower extremity Patient will need to [place] [facility] [place] 1 [place] Continue to elevate right lower extremity and apply ice Recommend begin taking 81 mg enteric-coated aspirin after finishing Lovenox at 1 week We will need to obtain x-rays and operative notes to take with patient to her orthopedist  Lynnda ShieldsJon R. [facility] HospitalWolfe PA [facility] 06/22/2017, 7:08 AM

## 2017-06-22 NOTE — Discharge Instructions (Signed)
Discharge instructions  Continue to elevate right lower extremity and apply ice Continue nonweightbearing right lower extremity Follow-up with orthopedist in 1 week Prescription for oxycodone given Lovenox injections for 1 week followed by taking 181 mg enteric-coated aspirin per day No bathing of right lower extremity until staples are removed at 2 weeks postop

## 2019-11-29 IMAGING — XA DG ANKLE 2V *R*
3 series · 3 of 3 positions shown · non-contrast
Comparison: None

FLUOROSCOPY TIME:  1 minutes 6 seconds

CLINICAL DATA: Right ankle ORIF

EXAM:
RIGHT ANKLE - 2 VIEW; DG C-ARM 61-120 MIN

[Series 11: cont. · 1 of 1 slices shown (1 of 3)]
[im 1/1]
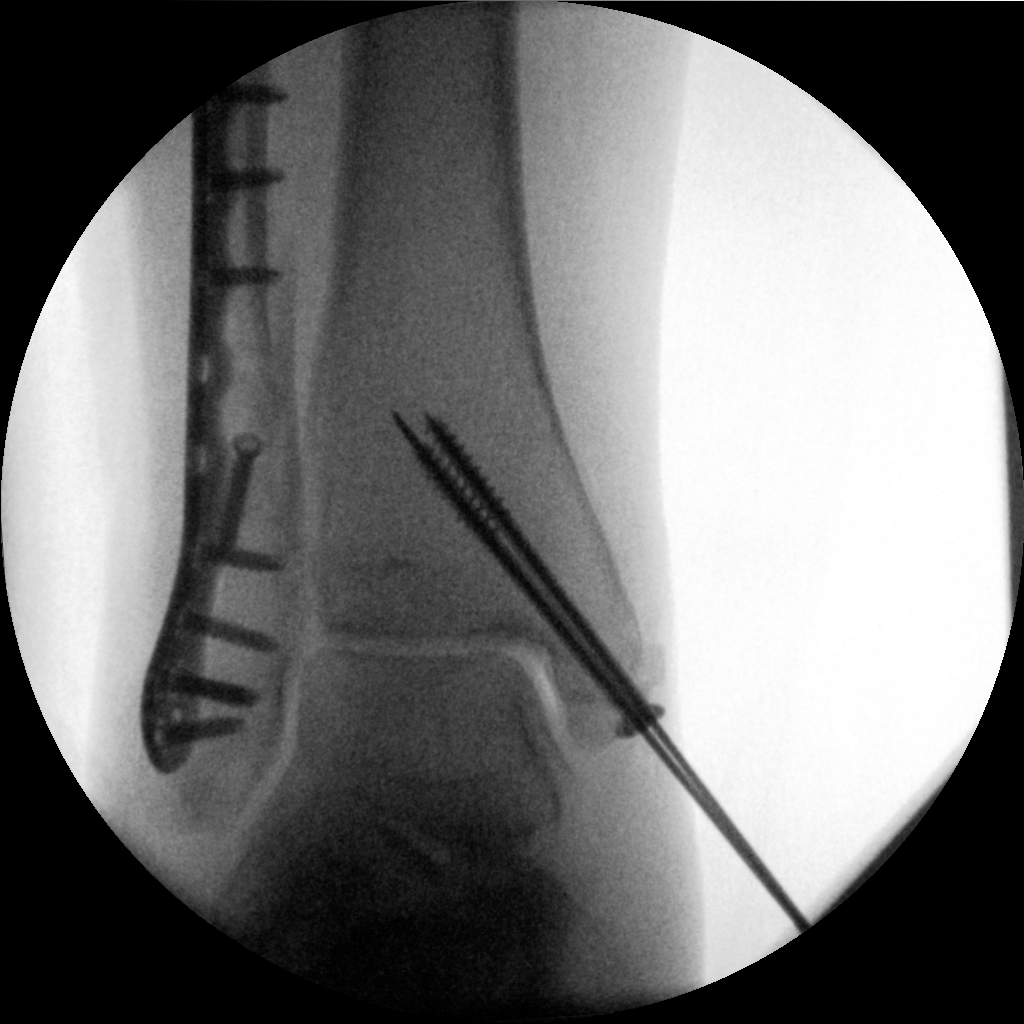

[Series 12: cont. · 1 of 1 slices shown (2 of 3)]
[im 1/1]
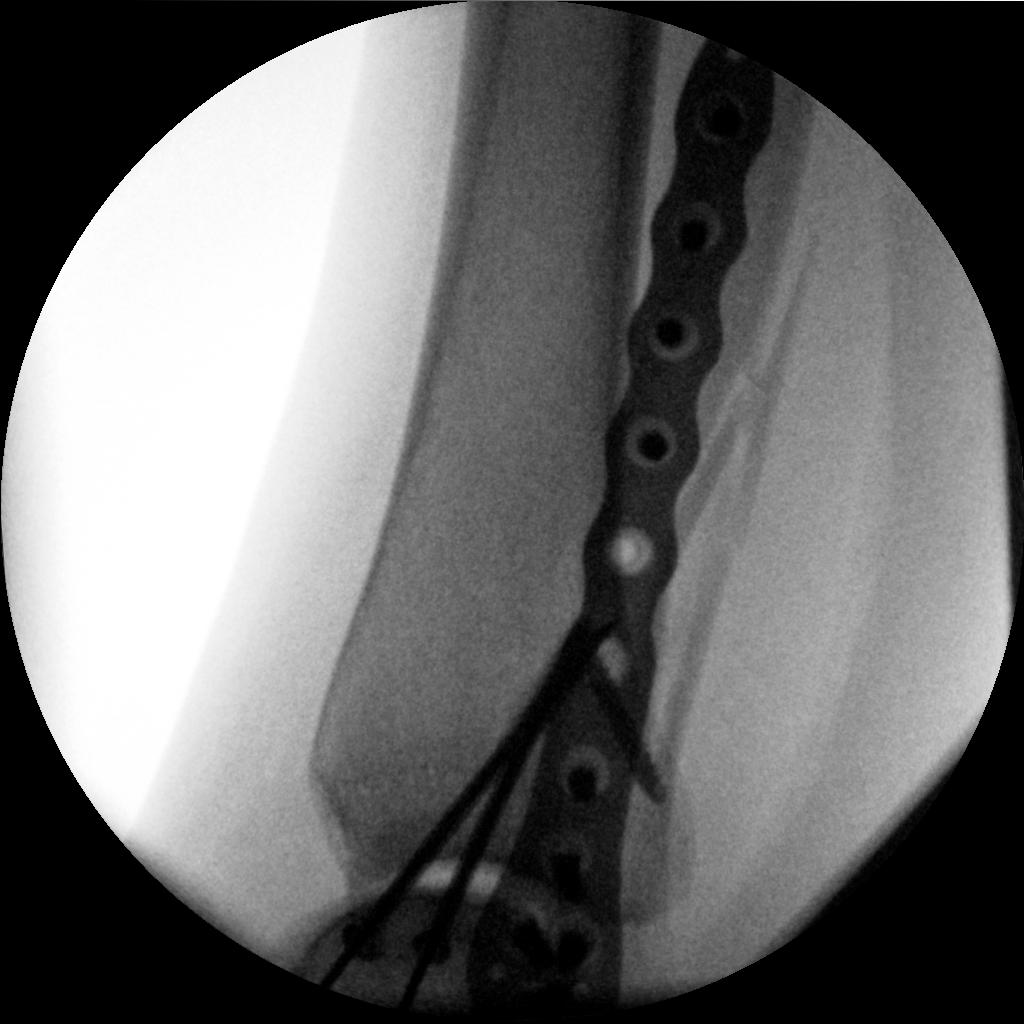

[Series 13: cont. · 1 of 1 slices shown (3 of 3)]
[im 1/1]
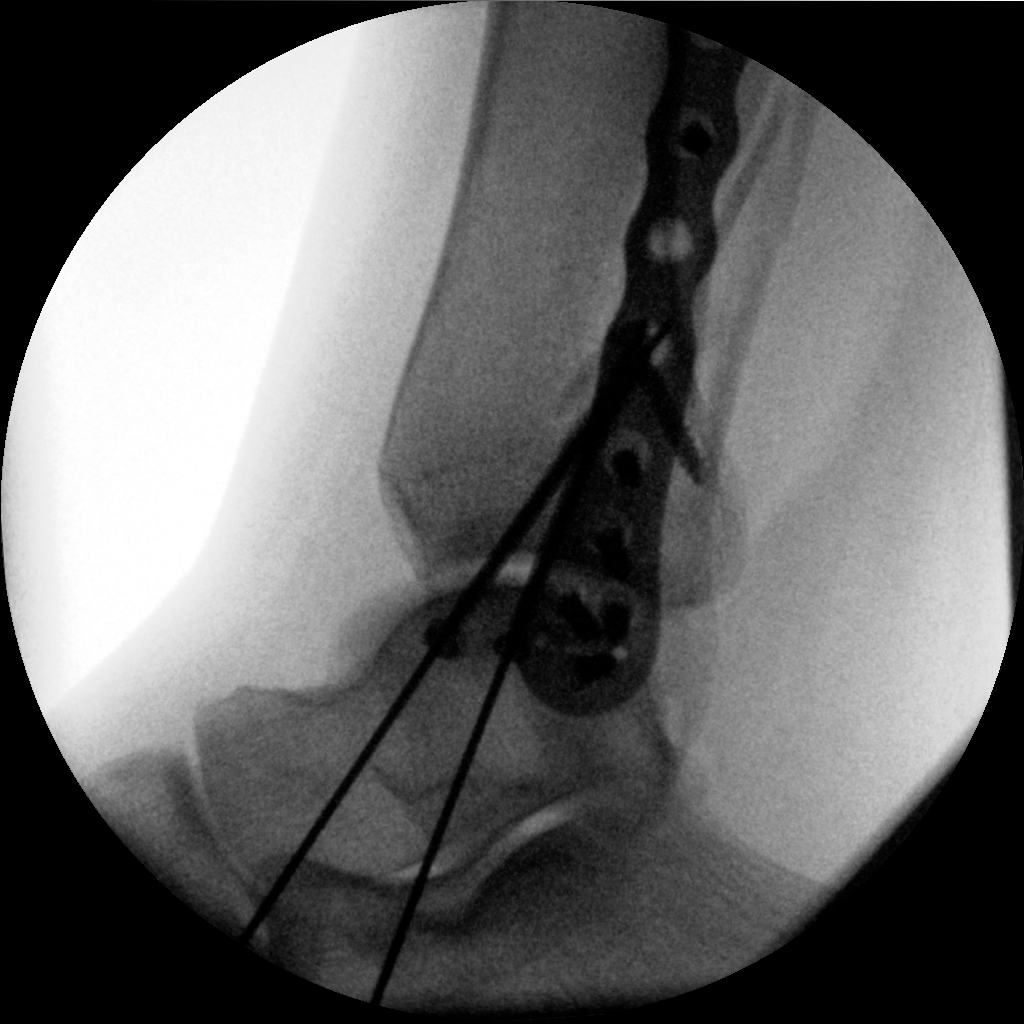

[3 of 3 positions shown; findings below may reference images not displayed]

FINDINGS: Medial malleolar fracture transfixed with 2 cannulated screws.
Distal fibular fracture transfixed with a metallic side plate and
multiple interlocking screws. Ankle mortise is intact. Alignment is
near anatomic.
IMPRESSION: Interval bimalleolar ORIF.
